# Patient Record
Sex: Male | Born: 1965 | Race: Black or African American | Hispanic: No | Marital: Married | Smoking: Never smoker
Health system: Southern US, Community
[De-identification: ages and names within clinical notes are randomized; demographics above are authoritative.]

## PROBLEM LIST (undated history)

## (undated) DIAGNOSIS — R42 Dizziness and giddiness: Secondary | ICD-10-CM

## (undated) HISTORY — PX: SURGERY SCROTAL / TESTICULAR: SUR1316

## (undated) HISTORY — PX: GANGLION CYST EXCISION: SHX1691

---

## 2011-06-11 ENCOUNTER — Encounter: Payer: Self-pay | Admitting: *Deleted

## 2011-06-11 ENCOUNTER — Emergency Department (HOSPITAL_BASED_OUTPATIENT_CLINIC_OR_DEPARTMENT_OTHER)
Admission: EM | Admit: 2011-06-11 | Discharge: 2011-06-11 | Discharge status: Against Medical Advice | Payer: BC Managed Care – PPO | Attending: Emergency Medicine | Admitting: Emergency Medicine

## 2011-06-11 DIAGNOSIS — R110 Nausea: Secondary | ICD-10-CM | POA: Insufficient documentation

## 2011-06-11 DIAGNOSIS — R197 Diarrhea, unspecified: Secondary | ICD-10-CM | POA: Insufficient documentation

## 2011-06-11 NOTE — ED Notes (Signed)
Pt c/o looses stools x 3 days and nausea x 1 day

## 2012-01-26 ENCOUNTER — Encounter (HOSPITAL_BASED_OUTPATIENT_CLINIC_OR_DEPARTMENT_OTHER): Payer: Self-pay | Admitting: *Deleted

## 2012-01-26 ENCOUNTER — Emergency Department (INDEPENDENT_AMBULATORY_CARE_PROVIDER_SITE_OTHER): Payer: BC Managed Care – PPO

## 2012-01-26 ENCOUNTER — Emergency Department (HOSPITAL_BASED_OUTPATIENT_CLINIC_OR_DEPARTMENT_OTHER)
Admission: EM | Admit: 2012-01-26 | Discharge: 2012-01-26 | Disposition: A | Discharge status: Home / Self Care | Payer: BC Managed Care – PPO | Attending: Emergency Medicine | Admitting: Emergency Medicine

## 2012-01-26 DIAGNOSIS — R059 Cough, unspecified: Secondary | ICD-10-CM | POA: Insufficient documentation

## 2012-01-26 DIAGNOSIS — R509 Fever, unspecified: Secondary | ICD-10-CM | POA: Insufficient documentation

## 2012-01-26 DIAGNOSIS — R0789 Other chest pain: Secondary | ICD-10-CM | POA: Insufficient documentation

## 2012-01-26 DIAGNOSIS — R0602 Shortness of breath: Secondary | ICD-10-CM | POA: Insufficient documentation

## 2012-01-26 DIAGNOSIS — R61 Generalized hyperhidrosis: Secondary | ICD-10-CM | POA: Insufficient documentation

## 2012-01-26 DIAGNOSIS — IMO0001 Reserved for inherently not codable concepts without codable children: Secondary | ICD-10-CM | POA: Insufficient documentation

## 2012-01-26 DIAGNOSIS — R141 Gas pain: Secondary | ICD-10-CM | POA: Insufficient documentation

## 2012-01-26 DIAGNOSIS — R197 Diarrhea, unspecified: Secondary | ICD-10-CM | POA: Insufficient documentation

## 2012-01-26 DIAGNOSIS — R05 Cough: Secondary | ICD-10-CM

## 2012-01-26 DIAGNOSIS — R142 Eructation: Secondary | ICD-10-CM | POA: Insufficient documentation

## 2012-01-26 DIAGNOSIS — R112 Nausea with vomiting, unspecified: Secondary | ICD-10-CM | POA: Insufficient documentation

## 2012-01-26 DIAGNOSIS — J111 Influenza due to unidentified influenza virus with other respiratory manifestations: Secondary | ICD-10-CM

## 2012-01-26 DIAGNOSIS — J3489 Other specified disorders of nose and nasal sinuses: Secondary | ICD-10-CM | POA: Insufficient documentation

## 2012-01-26 MED ORDER — ALBUTEROL SULFATE HFA 108 (90 BASE) MCG/ACT IN AERS
1.0000 | INHALATION_SPRAY | RESPIRATORY_TRACT | Status: DC | PRN
  Administered 2012-01-26: 2 via RESPIRATORY_TRACT
  Filled 2012-01-26: qty 6.7

## 2012-01-26 MED ORDER — HYDROCOD POLST-CHLORPHEN POLST 10-8 MG/5ML PO LQCR: 5.0000 mL | Freq: Two times a day (BID) | ORAL | Status: DC | PRN

## 2012-01-26 NOTE — Discharge Instructions (Signed)

## 2012-01-26 NOTE — ED Notes (Signed)
Pt sts he has had intermittent cough, congestion and N/V/D since Monday.

## 2012-01-27 NOTE — ED Provider Notes (Signed)
History     CSN: [id number]  Arrival date & time 01/26/12  1013   First MD Initiated Contact with Patient 01/26/12 1044      Chief Complaint  Patient presents with  . Emesis    (Consider location/radiation/quality/duration/timing/severity/associated sxs/prior treatment) HPI Comments: Pt reports some mild gneeralzied bloating of abd with several loose BM's starting about 5 days prior, has improved greatly, has had occasional N/V which also seems improved.  He now has been having sinus congestion, having some difficulty breathing through nose, productive cough of yellow phlegm and some chest tightness and occasional sharp pains, does not radiate to arms or jaw or back.  Slight SOB at times, but no exertional fatigue, sweats.  No weight loss, bloody cough, no recent travel, no lower leg swelling or pain at calf.  Pt took theraflue with some improvement yesterday, but this early AM, woke up with sweats, chills, felt chills so decided to be checked out.  He had a family member with similar symptoms who had gotten better, then came down with pneumonia so he was concerned.  Pt does not smoke.    Patient is a 46 y.o. male presenting with vomiting. The history is provided by the patient.  Emesis  Associated symptoms include chills, cough, diarrhea, a fever and myalgias. Pertinent negatives include no headaches.    History reviewed. No pertinent past medical history.  Past Surgical History  Procedure Date  . Ganglion cyst excision     History reviewed. No pertinent family history.  History  Substance Use Topics  . Smoking status: Never Smoker   . Smokeless tobacco: Not on file  . Alcohol Use: No      Review of Systems  Constitutional: Positive for fever, chills and appetite change.  HENT: Positive for sinus pressure. Negative for neck stiffness.   Respiratory: Positive for cough, chest tightness and shortness of breath.   Gastrointestinal: Positive for nausea, vomiting and diarrhea.  Negative for anal bleeding.  Musculoskeletal: Positive for myalgias.  Skin: Negative for rash.  Neurological: Negative for headaches.  All other systems reviewed and are negative.    Allergies  Review of patient's allergies indicates no known allergies.  Home Medications   Current Outpatient Rx  Name Route Sig Dispense Refill  . HYDROCOD POLST-CPM POLST ER 10-8 MG/5ML PO LQCR Oral Take 5 mLs by mouth every 12 (twelve) hours as needed. 100 mL 0  . OLANZAPINE 10 MG PO TABS Oral Take 10 mg by mouth at bedtime.        BP 112/74  Pulse 91  Temp(Src) 98.1 F (36.7 C) (Oral)  Resp 18  Ht 5\' 11"  (1.803 m)  Wt 210 lb (95.255 kg)  BMI 29.29 kg/m2  SpO2 97%  Physical Exam  Nursing note and vitals reviewed. Constitutional: He appears well-developed and well-nourished.  HENT:  Head: Normocephalic and atraumatic.  Nose: No mucosal edema or rhinorrhea. Right sinus exhibits no maxillary sinus tenderness and no frontal sinus tenderness. Left sinus exhibits no maxillary sinus tenderness and no frontal sinus tenderness.  Mouth/Throat: Uvula is midline and oropharynx is clear and moist.  Eyes: Pupils are equal, round, and reactive to light.  Neck: Normal range of motion. Neck supple.  Cardiovascular: Normal rate and regular rhythm.   Pulmonary/Chest: Effort normal. No respiratory distress. He has no wheezes. He has no rales.  Abdominal: Soft. He exhibits no distension. There is no tenderness. There is no guarding.  Neurological: He is alert. No cranial nerve deficit.  Skin:  Skin is warm and dry. No rash noted.    ED Course  Procedures (including critical care time)  Labs Reviewed - No data to display Dg Chest 2 View  01/26/2012  *RADIOLOGY REPORT*  Clinical Data: Productive cough  CHEST - 2 VIEW  Comparison: None.  Findings: Normal mediastinum and cardiac silhouette.  Normal pulmonary  vasculature.  No evidence of effusion, infiltrate, or pneumothorax.  No acute bony abnormality.   IMPRESSION: No acute cardiopulmonary process.  Original Report Authenticated By: Genevive Bi, M.D.     1. Influenza-like illness       MDM  Pt with normal vitals, O2 sats are normal at 97%.  Lungs clear.  CXR which I reviewed, per radiologist shows no acute disease.  Pt is reassured, provided inhaler which pt reports improved his overall symptoms, breathing better, tightness improved.  Also tuissionex prescription.  Abd is soft, he reports no nausea, vomiting and diarrhea has resolved presently.          Gavin Pound. Oletta Lamas, MD 01/27/12 [phone]

## 2012-12-16 ENCOUNTER — Encounter (HOSPITAL_BASED_OUTPATIENT_CLINIC_OR_DEPARTMENT_OTHER): Payer: Self-pay | Admitting: *Deleted

## 2012-12-16 ENCOUNTER — Emergency Department (HOSPITAL_BASED_OUTPATIENT_CLINIC_OR_DEPARTMENT_OTHER)
Admission: EM | Admit: 2012-12-16 | Discharge: 2012-12-16 | Disposition: A | Discharge status: Home / Self Care | Payer: BC Managed Care – PPO | Attending: Emergency Medicine | Admitting: Emergency Medicine

## 2012-12-16 DIAGNOSIS — R42 Dizziness and giddiness: Secondary | ICD-10-CM | POA: Insufficient documentation

## 2012-12-16 DIAGNOSIS — R6883 Chills (without fever): Secondary | ICD-10-CM | POA: Insufficient documentation

## 2012-12-16 DIAGNOSIS — Z79899 Other long term (current) drug therapy: Secondary | ICD-10-CM | POA: Insufficient documentation

## 2012-12-16 DIAGNOSIS — R61 Generalized hyperhidrosis: Secondary | ICD-10-CM | POA: Insufficient documentation

## 2012-12-16 DIAGNOSIS — H538 Other visual disturbances: Secondary | ICD-10-CM | POA: Insufficient documentation

## 2012-12-16 LAB — CBC WITH DIFFERENTIAL/PLATELET
Basophils Absolute: 0 10*3/uL (ref 0.0–0.1)
Eosinophils Relative: 5 % (ref 0–5)
Lymphocytes Relative: 34 % (ref 12–46)
Lymphs Abs: 1.5 10*3/uL (ref 0.7–4.0)
MCV: 90.6 fL (ref 78.0–100.0)
Neutrophils Relative %: 44 % (ref 43–77)
Platelets: 220 10*3/uL (ref 150–400)
RBC: 4.81 MIL/uL (ref 4.22–5.81)
RDW: 13.6 % (ref 11.5–15.5)
WBC: 4.4 10*3/uL (ref 4.0–10.5)

## 2012-12-16 LAB — BASIC METABOLIC PANEL
CO2: 27 mEq/L (ref 19–32)
Calcium: 9.4 mg/dL (ref 8.4–10.5)
GFR calc non Af Amer: 87 mL/min — ABNORMAL LOW (ref >90)
Potassium: 3.6 mEq/L (ref 3.5–5.1)
Sodium: 141 mEq/L (ref 135–145)

## 2012-12-16 LAB — URINALYSIS, ROUTINE W REFLEX MICROSCOPIC
Glucose, UA: NEGATIVE mg/dL
Leukocytes, UA: NEGATIVE
Protein, ur: NEGATIVE mg/dL
Specific Gravity, Urine: 1.028 (ref 1.005–1.030)
Urobilinogen, UA: 1 mg/dL (ref 0.0–1.0)

## 2012-12-16 LAB — GLUCOSE, CAPILLARY: Glucose-Capillary: 83 mg/dL (ref 70–99)

## 2012-12-16 MED ORDER — MECLIZINE HCL 25 MG PO TABS: 25.0000 mg | ORAL_TABLET | Freq: Three times a day (TID) | ORAL | Status: DC | PRN

## 2012-12-16 NOTE — ED Notes (Signed)
Pt. Has no slurred speech and is in no distress.  Pt. Able to walk with c/o feeling dizzy.

## 2012-12-16 NOTE — ED Notes (Signed)
Patient states he was at work this morning and developed dizziness and blurry vision around 0730 am.  States he was seen by the Nurse at work, and told he may have an inner ear infection.  Pt was driven home and told to rest and follow up with the ed later in the day.  Pt states he does not feel any better all day.  Pt states he took a zyrtec last night for his allergies, which he uses as needed.

## 2012-12-16 NOTE — ED Provider Notes (Signed)
Medical screening examination/treatment/procedure(s) were performed by non-physician practitioner and as supervising physician I was immediately available for consultation/collaboration.    Akili Corsetti R Daiwik Buffalo, MD 12/16/12 2344 

## 2012-12-16 NOTE — ED Provider Notes (Signed)
History     CSN: [id number]  Arrival date & time 12/16/12  1620   First MD Initiated Contact with Patient 12/16/12 1717      Chief Complaint  Patient presents with  . Dizziness    (Consider location/radiation/quality/duration/timing/severity/associated sxs/prior treatment) HPI Comments: Pt presents for dizziness x 1 day.  States he went to to work today and began having episodes of blurred vision and feeling off balance.  Went to see the nurse at work and she suggested he go home, rest, and follow up with the ED if no improvement.  States at home he started having sweats with some chills. Symptoms are worse with movement, especially of his head, and relieved by lying still.  Has taken OTC Zyrtec for congestion.  Denies any prior episodes of dizziness.  Denies any chest pain, SOB, abdominal pain, diarrhea or vomiting.  The history is provided by the patient.    History reviewed. No pertinent past medical history.  Past Surgical History  Procedure Date  . Ganglion cyst excision     No family history on file.  History  Substance Use Topics  . Smoking status: Never Smoker   . Smokeless tobacco: Not on file  . Alcohol Use: No      Review of Systems  Neurological: Positive for dizziness and light-headedness.  All other systems reviewed and are negative.    Allergies  Review of patient's allergies indicates no known allergies.  Home Medications   Current Outpatient Rx  Name  Route  Sig  Dispense  Refill  . CETIRIZINE HCL 10 MG PO TABS   Oral   Take 10 mg by mouth daily.         Marland Kitchen HYDROCOD POLST-CPM POLST ER 10-8 MG/5ML PO LQCR   Oral   Take 5 mLs by mouth every 12 (twelve) hours as needed.   100 mL   0   . OLANZAPINE 10 MG PO TABS   Oral   Take 10 mg by mouth at bedtime.             BP 124/85  Pulse 60  Temp 98.5 F (36.9 C) (Oral)  Resp 16  Ht 6' (1.829 m)  Wt 210 lb (95.255 kg)  BMI 28.48 kg/m2  SpO2 100%  Physical Exam  Nursing note and  vitals reviewed. Constitutional: He is oriented to person, place, and time. He appears well-developed and well-nourished.  HENT:  Head: Normocephalic and atraumatic.  Mouth/Throat: Oropharynx is clear and moist.  Eyes: Conjunctivae normal and EOM are normal. Pupils are equal, round, and reactive to light.  Neck: Normal range of motion.  Cardiovascular: Normal rate, regular rhythm and normal heart sounds.   Pulmonary/Chest: Effort normal and breath sounds normal. No respiratory distress.  Abdominal: Soft. Bowel sounds are normal.  Lymphadenopathy:    He has cervical adenopathy (left).  Neurological: He is alert and oriented to person, place, and time. He has normal strength. No cranial nerve deficit or sensory deficit. Coordination and gait normal. GCS eye subscore is 4. GCS verbal subscore is 5. GCS motor subscore is 6.  Skin: Skin is warm and dry.  Psychiatric: He has a normal mood and affect.    ED Course  Procedures (including critical care time)  Labs Reviewed  CBC WITH DIFFERENTIAL - Abnormal; Notable for the following:    Monocytes Relative 16 (*)     All other components within normal limits  BASIC METABOLIC PANEL - Abnormal; Notable for the following:  GFR calc non Af Amer 87 (*)     All other components within normal limits  URINALYSIS, ROUTINE W REFLEX MICROSCOPIC  GLUCOSE, CAPILLARY    Date: 12/16/2012  Rate: 56  Rhythm: sinus bradycardia  QRS Axis: normal  Intervals: normal  ST/T Wave abnormalities: normal  Conduction Disutrbances:none  Narrative Interpretation: sinus bradycardia  Old EKG Reviewed: none available   No results found.   1. Dizziness       MDM  5:45 PM Pt evaluated.  Dizziness persists at this time.  CBC, BMP, CBG, EKG, U/A, and orthostatics pending.  6:52 PM Labs largely unremarkable with the exception of the above.  Pt stable and ok for discharge.  Discussed trial of Meclizine with patient and he agreed.  Encouraged to follow up with  his PCP within the next week to discuss ED visit, medications, and further testing.  Educated patient and wife about stroke sx.   Encouraged to return to the ED for any new or worsening symptoms.      Garlon Hatchet, PA-C 12/16/12 2208

## 2012-12-16 NOTE — ED Notes (Signed)
Family at bedside. 

## 2016-07-21 ENCOUNTER — Encounter (HOSPITAL_BASED_OUTPATIENT_CLINIC_OR_DEPARTMENT_OTHER): Payer: Self-pay | Admitting: *Deleted

## 2016-07-21 ENCOUNTER — Emergency Department (HOSPITAL_BASED_OUTPATIENT_CLINIC_OR_DEPARTMENT_OTHER)
Admission: EM | Admit: 2016-07-21 | Discharge: 2016-07-21 | Disposition: A | Discharge status: Home / Self Care | Payer: BLUE CROSS/BLUE SHIELD | Attending: Emergency Medicine | Admitting: Emergency Medicine

## 2016-07-21 DIAGNOSIS — J069 Acute upper respiratory infection, unspecified: Secondary | ICD-10-CM | POA: Diagnosis present

## 2016-07-21 DIAGNOSIS — J0100 Acute maxillary sinusitis, unspecified: Secondary | ICD-10-CM | POA: Diagnosis not present

## 2016-07-21 HISTORY — DX: Dizziness and giddiness: R42

## 2016-07-21 MED ORDER — AMOXICILLIN 500 MG PO CAPS: 500.0000 mg | ORAL_CAPSULE | Freq: Three times a day (TID) | ORAL | 0 refills | Status: DC

## 2016-07-21 NOTE — ED Triage Notes (Addendum)
Patient states he has a one week history of bilateral eye watering and redness.  States his symptoms have progressed to general fatigue, headache, sinus drainage and nausea.  Denies fever, but has had some chills and sweats.  States several days ago, he felt a pop in his right ear and felt like he had drainage in the ear.

## 2016-07-21 NOTE — ED Provider Notes (Signed)
[facility]   CSN: [id number] Arrival date & time: 07/21/16  1019     History   Chief Complaint Chief Complaint  Patient presents with  . URI    HPI Sean Hunter is a 50 y.o. male.  The history is provided by the patient. No language interpreter was used.  URI   This is a new problem. The problem has been gradually worsening. There has been no fever. Associated symptoms include congestion, headaches, sinus pain and sore throat. He has tried nothing for the symptoms.  Pt has a history of sinus infections.  Pt complains of facial pain, sinus drainage, ear congestion.  Past Medical History:  Diagnosis Date  . Vertigo     There are no active problems to display for this patient.   Past Surgical History:  Procedure Laterality Date  . GANGLION CYST EXCISION    . SURGERY SCROTAL / TESTICULAR         Home Medications    Prior to Admission medications   Medication Sig Start Date End Date Taking? Authorizing Provider  amoxicillin (AMOXIL) 500 MG capsule Take 1 capsule (500 mg total) by mouth 3 (three) times daily. 07/21/16   Elson Areas, PA-C  cetirizine (ZYRTEC) 10 MG tablet Take 10 mg by mouth daily.    Historical Provider, MD  chlorpheniramine-HYDROcodone (TUSSIONEX PENNKINETIC ER) 10-8 MG/5ML LQCR Take 5 mLs by mouth every 12 (twelve) hours as needed. 01/26/12   Quita Skye, MD  meclizine (ANTIVERT) 25 MG tablet Take 1 tablet (25 mg total) by mouth 3 (three) times daily as needed for dizziness. 12/16/12   Garlon Hatchet, PA-C  OLANZapine (ZYPREXA) 10 MG tablet Take 10 mg by mouth at bedtime.      Historical Provider, MD    Family History No family history on file.  Social History Social History  Substance Use Topics  . Smoking status: Never Smoker  . Smokeless tobacco: Never Used  . Alcohol use No     Allergies   Review of patient's allergies indicates no known allergies.   Review of Systems Review of Systems  HENT: Positive  for congestion and sore throat.   Neurological: Positive for headaches.  All other systems reviewed and are negative.    Physical Exam Updated Vital Signs BP 121/84 (BP Location: Right Arm)   Pulse 61   Temp 98.8 F (37.1 C) (Oral)   Resp 18   Ht 6' (1.829 m)   Wt 95.3 kg   SpO2 100%   BMI 28.48 kg/m   Physical Exam  Constitutional: He appears well-developed and well-nourished.  HENT:  Head: Normocephalic and atraumatic.  Eyes: Conjunctivae are normal.  Neck: Neck supple.  Cardiovascular: Normal rate and regular rhythm.   No murmur heard. Pulmonary/Chest: Effort normal and breath sounds normal. No respiratory distress.  Abdominal: Soft. There is no tenderness.  Musculoskeletal: He exhibits no edema.  Neurological: He is alert.  Skin: Skin is warm and dry.  Psychiatric: He has a normal mood and affect.  Nursing note and vitals reviewed.    ED Treatments / Results  Labs (all labs ordered are listed, but only abnormal results are displayed) Labs Reviewed - No data to display  EKG  EKG Interpretation None       Radiology No results found.  Procedures Procedures (including critical care time)  Medications Ordered in ED Medications - No data to display   Initial Impression / Assessment and Plan / ED Course  I  have reviewed the triage vital signs and the nursing notes.  Pertinent labs & imaging results that were available during my care of the patient were reviewed by me and considered in my medical decision making (see chart for details).  Clinical Course    Pt given rx for amoxicillian, I advisedd pt to start back on his allegra D.   Final Clinical Impressions(s) / ED Diagnoses   Final diagnoses:  Subacute maxillary sinusitis    New Prescriptions New Prescriptions   AMOXICILLIN (AMOXIL) 500 MG CAPSULE    Take 1 capsule (500 mg total) by mouth 3 (three) times daily.  An After Visit Summary was printed and given to the patient.   Lonia SkinnerLeslie K  ChillicotheSofia, PA-C 07/21/16 1046    Jacalyn LefevreJulie Haviland, MD 07/21/16 [phone]

## 2017-04-14 ENCOUNTER — Emergency Department (HOSPITAL_BASED_OUTPATIENT_CLINIC_OR_DEPARTMENT_OTHER): Payer: BLUE CROSS/BLUE SHIELD

## 2017-04-14 ENCOUNTER — Encounter (HOSPITAL_BASED_OUTPATIENT_CLINIC_OR_DEPARTMENT_OTHER): Payer: Self-pay | Admitting: *Deleted

## 2017-04-14 ENCOUNTER — Emergency Department (HOSPITAL_BASED_OUTPATIENT_CLINIC_OR_DEPARTMENT_OTHER)
Admission: EM | Admit: 2017-04-14 | Discharge: 2017-04-14 | Disposition: A | Discharge status: Home / Self Care | Payer: BLUE CROSS/BLUE SHIELD | Attending: Emergency Medicine | Admitting: Emergency Medicine

## 2017-04-14 DIAGNOSIS — R1013 Epigastric pain: Secondary | ICD-10-CM

## 2017-04-14 LAB — COMPREHENSIVE METABOLIC PANEL
ALBUMIN: 3.9 g/dL (ref 3.5–5.0)
ALK PHOS: 66 U/L (ref 38–126)
ALT: 23 U/L (ref 17–63)
ANION GAP: 7 (ref 5–15)
AST: 21 U/L (ref 15–41)
BILIRUBIN TOTAL: 0.6 mg/dL (ref 0.3–1.2)
BUN: 12 mg/dL (ref 6–20)
CALCIUM: 9.1 mg/dL (ref 8.9–10.3)
CO2: 27 mmol/L (ref 22–32)
Chloride: 104 mmol/L (ref 101–111)
Creatinine, Ser: 1.08 mg/dL (ref 0.61–1.24)
GFR calc Af Amer: >60 mL/min (ref >60)
GFR calc non Af Amer: >60 mL/min (ref >60)
GLUCOSE: 99 mg/dL (ref 65–99)
Potassium: 4.1 mmol/L (ref 3.5–5.1)
Sodium: 138 mmol/L (ref 135–145)
TOTAL PROTEIN: 7.1 g/dL (ref 6.5–8.1)

## 2017-04-14 LAB — CBC WITH DIFFERENTIAL/PLATELET
BASOS PCT: 0 %
Basophils Absolute: 0 10*3/uL (ref 0.0–0.1)
Eosinophils Absolute: 0.2 10*3/uL (ref 0.0–0.7)
Eosinophils Relative: 5 %
HEMATOCRIT: 41.6 % (ref 39.0–52.0)
HEMOGLOBIN: 13.9 g/dL (ref 13.0–17.0)
LYMPHS ABS: 1.6 10*3/uL (ref 0.7–4.0)
LYMPHS PCT: 32 %
MCH: 30.5 pg (ref 26.0–34.0)
MCHC: 33.4 g/dL (ref 30.0–36.0)
MCV: 91.4 fL (ref 78.0–100.0)
Monocytes Absolute: 0.6 10*3/uL (ref 0.1–1.0)
Monocytes Relative: 13 %
NEUTROS ABS: 2.4 10*3/uL (ref 1.7–7.7)
NEUTROS PCT: 50 %
Platelets: 240 10*3/uL (ref 150–400)
RBC: 4.55 MIL/uL (ref 4.22–5.81)
RDW: 14.1 % (ref 11.5–15.5)
WBC: 4.9 10*3/uL (ref 4.0–10.5)

## 2017-04-14 LAB — LIPASE, BLOOD: Lipase: 19 U/L (ref 11–51)

## 2017-04-14 LAB — URINALYSIS, ROUTINE W REFLEX MICROSCOPIC
Bilirubin Urine: NEGATIVE
Glucose, UA: NEGATIVE mg/dL
Hgb urine dipstick: NEGATIVE
KETONES UR: NEGATIVE mg/dL
LEUKOCYTES UA: NEGATIVE
NITRITE: NEGATIVE
PH: 5.5 (ref 5.0–8.0)
Protein, ur: NEGATIVE mg/dL
SPECIFIC GRAVITY, URINE: 1.025 (ref 1.005–1.030)

## 2017-04-14 LAB — TROPONIN I: Troponin I: <0.03 ng/mL (ref <0.03)

## 2017-04-14 MED ORDER — OMEPRAZOLE 20 MG PO CPDR: 20.0000 mg | DELAYED_RELEASE_CAPSULE | Freq: Every day | ORAL | 0 refills | Status: DC

## 2017-04-14 MED ORDER — RANITIDINE HCL 150 MG PO CAPS: 150.0000 mg | ORAL_CAPSULE | Freq: Every day | ORAL | 0 refills | Status: DC

## 2017-04-14 MED ORDER — GI COCKTAIL ~~LOC~~
30.0000 mL | Freq: Once | ORAL | Status: AC
  Administered 2017-04-14: 30 mL via ORAL
  Filled 2017-04-14: qty 30

## 2017-04-14 NOTE — ED Triage Notes (Signed)
Pt reports intermittent epigastric pain x 4-5 days. Reports taking acid reflux medication without relief.  Denies SOB, N/V.

## 2017-04-14 NOTE — Discharge Instructions (Signed)
Your lab work, chest x-ray, EKG and heart enzymes were normal today.   I suspect your symptoms are from acid reflux, less likely ulcers.   Please take omeprazole and ranitidine as prescribed. Use maalox for additional symptom control. Follow up with a gastroenterology for re-evaluation within 7-10 days and further treatment and diagnostic work up  Return to the emergency department if you develop worsening chest/upper abdominal pain with shortness of breath, nausea, vomiting, sweating, or light-headedness

## 2017-04-14 NOTE — ED Provider Notes (Signed)
[facility] Note   CSN: [id number] Arrival date & time: 04/14/17  1557  By signing my name below, I, Deland PrettySherilynn Knight, attest that this documentation has been prepared under the direction and in the presence of Sharen Hecklaudia Penelope Fittro, PA-C. Electronically Signed: Deland PrettySherilynn Knight, ED Scribe. 04/14/17. 8:09 PM.  History   Chief Complaint Chief Complaint  Patient presents with  . Abdominal Pain   The history is provided by the patient. No language interpreter was used.   HPI Comments: Sean Hunter is a 51 y.o. male, with a PMHx of GERD, who presents to the Emergency Department complaining of 3 episodes of intermittent "sharp" "stinging" moderate epigastric abdominal pain that radiates to chest bone and bilateral chest that began last Wednesday (04/10/2017). Symptoms worsen when he lays on his back, and are slightly alleviated when he lays on his sides. Not worse with exacerbation or food intake. Patient reports mild temporary relief with reflux medication. No PMSx of abdominal surgeries. Denies h/o CAD, MI, DM, ulcers. No heavy use of ETOH or NSAIDs. No associated fever, palpitations, light-headedness, SOB, nausea, vomiting, diarrhea, and melena. Patient currently experiencing symptoms in ED.    Past Medical History:  Diagnosis Date  . Vertigo     There are no active problems to display for this patient.   Past Surgical History:  Procedure Laterality Date  . GANGLION CYST EXCISION    . SURGERY SCROTAL / TESTICULAR         Home Medications    Prior to Admission medications   Medication Sig Start Date End Date Taking? Authorizing Provider  omeprazole (PRILOSEC) 20 MG capsule Take 1 capsule (20 mg total) by mouth daily. 04/14/17 04/29/17  Liberty HandyGibbons, Alani Sabbagh J, PA-C  ranitidine (ZANTAC) 150 MG capsule Take 1 capsule (150 mg total) by mouth daily. 04/14/17   Liberty HandyGibbons, Jerry Haugen J, PA-C    Family History History reviewed. No pertinent family history.  Social  History Social History  Substance Use Topics  . Smoking status: Never Smoker  . Smokeless tobacco: Never Used  . Alcohol use No     Allergies   Patient has no known allergies.   Review of Systems Review of Systems  Constitutional: Negative for fever.  Respiratory: Negative for shortness of breath.   Cardiovascular: Positive for chest pain.  Gastrointestinal: Positive for abdominal pain. Negative for diarrhea, nausea and vomiting.  Genitourinary:       No melena  Musculoskeletal: Negative for back pain and gait problem.     Physical Exam Updated Vital Signs BP (!) 124/91   Pulse (!) 54   Temp 97.8 F (36.6 C) (Oral)   Resp 13   Ht 6' (1.829 m)   Wt 97.1 kg (214 lb)   SpO2 100%   BMI 29.02 kg/m   Physical Exam  Constitutional: He is oriented to person, place, and time. He appears well-developed and well-nourished. No distress.  NAD.  HENT:  Head: Normocephalic and atraumatic.  Right Ear: External ear normal.  Left Ear: External ear normal.  Nose: Nose normal.  Moist mucous membranes. No oropharynx cobblestoning  Eyes: Conjunctivae and EOM are normal. Pupils are equal, round, and reactive to light. No scleral icterus.  Neck: Normal range of motion. Neck supple. No JVD present.  Cardiovascular: Normal rate, regular rhythm, S1 normal, S2 normal, normal heart sounds and intact distal pulses.  Exam reveals no gallop and no friction rub.   No murmur heard. Pulses:      Carotid pulses are 2+ on  the right side, and 2+ on the left side.      Radial pulses are 2+ on the right side, and 2+ on the left side.       Dorsalis pedis pulses are 2+ on the right side, and 2+ on the left side.  SBP and HR wnl. No JVD. Carotid pulses 2+ bilaterally without bruits. Capillary refill brisk in upper extremities.  No varicosities seen. No lower extremity edema.  No orthopnea.  Good lung sounds without crackles.  Pulmonary/Chest: Effort normal and breath sounds normal. He has no  wheezes. He exhibits tenderness.    Abdominal: Soft. He exhibits no distension and no mass. There is tenderness in the right upper quadrant and epigastric area. There is no rebound, no guarding and no CVA tenderness. No hernia.    +Epigastric abdominal tenderness No CVAT bilaterally.  No suprapubic tenderness. Negative Murphy's. Negative McBurney's. Negative Psoas sign.   Musculoskeletal: Normal range of motion. He exhibits no deformity.  Neurological: He is alert and oriented to person, place, and time.  Skin: Skin is warm and dry. Capillary refill takes less than 2 seconds.  Psychiatric: He has a normal mood and affect. His behavior is normal. Judgment and thought content normal.  Nursing note and vitals reviewed.    ED Treatments / Results   DIAGNOSTIC STUDIES: Oxygen Saturation is 100% on RA, normal by my interpretation.   COORDINATION OF CARE: 7:53 PM-Discussed next steps with pt. Pt verbalized understanding and is agreeable with the plan.   Labs (all labs ordered are listed, but only abnormal results are displayed) Labs Reviewed  URINALYSIS, ROUTINE W REFLEX MICROSCOPIC  CBC WITH DIFFERENTIAL/PLATELET  COMPREHENSIVE METABOLIC PANEL  LIPASE, BLOOD  TROPONIN I    EKG  EKG Interpretation  Date/Time:  Sunday April 14 2017 16:09:05 EDT Ventricular Rate:  69 PR Interval:  152 QRS Duration: 84 QT Interval:  368 QTC Calculation: 394 R Axis:   26 Text Interpretation:  Normal sinus rhythm Normal ECG When compared to prior, no significant changes seen.  No STEMI Confirmed by Tegeler, Chris ([id number]) on 04/14/2017 8:04:54 PM       Radiology Dg Chest 2 View  Result Date: 04/14/2017 CLINICAL DATA:  Patient with chest pain and abdominal pain for multiple days. Headache. EXAM: CHEST  2 VIEW COMPARISON:  Chest radiograph 01/26/2012 FINDINGS: Stable cardiac and mediastinal contours. No consolidative pulmonary opacities. No pleural effusion or pneumothorax. Mid thoracic spine  degenerative changes. IMPRESSION: No acute cardiopulmonary process. Electronically Signed   By: Drew  Davis M.D.   On: 04/14/2017 19:40    Procedures Procedures (including critical care time)  Medications Ordered in ED Medications  gi cocktail (Maalox,Lidocaine,Donnatal) (30 mLs Oral Given 04/14/17 2005)     Initial Impression / Assessment and Plan / ED Course  I have reviewed the triage vital signs and the nursing notes.  Pertinent labs & imaging results that were available during my care of the patient were reviewed by me and considered in my medical decision making (see chart for details).     50  year old male presents with epigastric "stinging" discomfort that radiates to sternum and bilateral chest wall 4 days. Symptoms initially intermittent however over the last 24 hours have been constant which is why patient came to ED. Mild, temporary relief with acid reflux medicine. Symptoms worse when laying supine and in the morning. No associated shortness of breath, nausea, vomiting, diaphoresis, palpitations, lightheadedness. No known CAD. Patient is currently having symptoms in ED. Concerned for ACS vs  GERD vs PUD vs PE. History most suggestive of GERD, less suggestive of others.  No h/o HTN, hypercholesterolemia, DM, smoking, positive family hx.  BMI elevated.  On exam VS are wnl. Cardiovascular and pulmonary exam benign. CXR, EKG, troponin x 1 within normal limits.  U/A, CBC, CMP and lipase unremarkable.  Heart score = 1.  Pain has been constant today, delta troponin thought not to be necessary.  Pt was given GI cocktail in ED with significant improvement in symptoms. Patient has ambulated and tolerated PO in ED. Given symptoms, reassuring ED work up, low risk HEART score patient will be discharged with recommendation to follow up with PCP, GI and cardiologist in regards to today's hospital visit. ED return preacutions given. Pt appears reliable for follow up and is agreeable to discharge. Will  d/c with omeprazole, ranitidine and maalox.    Final Clinical Impressions(s) / ED Diagnoses   Final diagnoses:  Epigastric abdominal pain    New Prescriptions Discharge Medication List as of 04/14/2017  9:23 PM    START taking these medications   Details  omeprazole (PRILOSEC) 20 MG capsule Take 1 capsule (20 mg total) by mouth daily., Starting Sun 04/14/2017, Until Mon 04/29/2017, Print    ranitidine (ZANTAC) 150 MG capsule Take 1 capsule (150 mg total) by mouth daily., Starting Sun 04/14/2017, Print       I personally performed the services described in this documentation, which was scribed in my presence. The recorded information has been reviewed and is accurate.      Liberty Handy, PA-C 04/15/17 1610    Tegeler, Canary Brim, MD 04/15/17 [phone]

## 2017-04-15 MED FILL — OMEPRAZOLE DR 20 MG CAPSULE: 20 | 15 days supply | Qty: 15 | Fill #0

## 2017-04-15 MED FILL — raNITIdine HCL 150 MG TABS: 150 | 30 days supply | Qty: 30 | Fill #0

## 2017-04-19 ENCOUNTER — Ambulatory Visit: Payer: BLUE CROSS/BLUE SHIELD | Attending: Internal Medicine | Admitting: Physician Assistant

## 2017-04-19 DIAGNOSIS — R42 Dizziness and giddiness: Secondary | ICD-10-CM | POA: Insufficient documentation

## 2017-04-19 DIAGNOSIS — R1013 Epigastric pain: Secondary | ICD-10-CM | POA: Diagnosis present

## 2017-04-19 NOTE — Progress Notes (Signed)
Patient ID: Sean Hunter, male   DOB: 08/17/1966, 51 y.o.   MRN: [id number]   Sean Hunter, is a 51 y.o. male  [id number]  [id number]  DOB - 12/05/1965  Subjective:  Chief Complaint and HPI: Sean Hunter is a 51 y.o. male here today to establish care and for a follow up visit After being seen in the ED 04/14/2017 for sharp moderate epigastric pain.  The pain has improved on prilosec and zantac. The pain was usu on an empty stomach.  No SOB/palpitations.  No FH early cardiac events.  He is not a smoker.  No h/o htn or DM.  He denies change in appetite.  No constipation/diarrhea/hematochezia.    EKG: Normal sinus rhythm Normal ECG When compared to prior, no significant changes seen.  No STEMI  From ED note: Symptoms worse when laying supine and in the morning. No associated shortness of breath, nausea, vomiting, diaphoresis, palpitations, lightheadedness. No known CAD. Patient is currently having symptoms in ED. Concerned for ACS vs GERD vs PUD vs PE. History most suggestive of GERD, less suggestive of others.  No h/o HTN, hypercholesterolemia, DM, smoking, positive family hx.  BMI elevated.  On exam VS are wnl. Cardiovascular and pulmonary exam benign.CXR, EKG, troponin x 1 within normal limits. U/A, CBC, CMP and lipase unremarkable. Heart score = 1.  Pain has been constant today, delta troponin thought not to be necessary. Pt was given GI cocktail in ED with significant improvement in symptoms. Patient has ambulated and tolerated PO in ED. Given symptoms, reassuring ED work up, low risk HEART score patient will be discharged with recommendation to follow up with PCP, GI and cardiologist in regards to today's hospital visit. ED return preacutions given. Pt appears reliable for follow up and is agreeable to discharge. Will d/c with omeprazole, ranitidine and maalox.  Enzymes negative.  CMP, UA, lipase, CBC all WNL.   Marland Kitchen.   ED/Hospital notes reviewed.   Social History:  married  ROS:    Constitutional:  No f/c, No night sweats, No unexplained weight loss. EENT:  No vision changes, No blurry vision, No hearing changes. No mouth, throat, or ear problems.  Respiratory: No cough, No SOB Cardiac: No CP, no palpitations GI:  +improving abd pain, No N/V/D. GU: No Urinary s/sx Musculoskeletal: No joint pain Neuro: No headache, no dizziness, no motor weakness.  Skin: No rash Endocrine:  No polydipsia. No polyuria.  Psych: Denies SI/HI  No problems updated.  ALLERGIES: No Known Allergies  PAST MEDICAL HISTORY: Past Medical History:  Diagnosis Date  . Vertigo     MEDICATIONS AT HOME: Prior to Admission medications   Medication Sig Start Date End Date Taking? Authorizing Provider  omeprazole (PRILOSEC) 20 MG capsule Take 1 capsule (20 mg total) by mouth daily. 04/14/17 04/29/17  Liberty HandyGibbons, Claudia J, PA-C  ranitidine (ZANTAC) 150 MG capsule Take 1 capsule (150 mg total) by mouth daily. 04/14/17   Liberty HandyGibbons, Claudia J, PA-C     Objective:  EXAM:   BP:  108/72 P: 84 Pulse ox 97% R 20 T  98.6  General appearance : A&OX3. NAD. Non-toxic-appearing HEENT: Atraumatic and Normocephalic.  PERRLA. EOM intact.   Neck: supple, no JVD. No cervical lymphadenopathy. No thyromegaly Chest/Lungs:  Breathing-non-labored, Good air entry bilaterally, breath sounds normal without rales, rhonchi, or wheezing  CVS: S1 S2 regular, no murmurs, gallops, rubs  Abdomen: Bowel sounds present, +tender in mid epigastric region and not distended with no gaurding, rigidity or rebound. Extremities: Bilateral Lower  Ext shows no edema, both legs are warm to touch with = pulse throughout Neurology:  CN II-XII grossly intact, Non focal.   Psych:  TP linear. J/I WNL. Normal speech. Appropriate eye contact and affect.  Skin:  No Rash  Data Review No results found for: HGBA1C   Assessment & Plan   1. Epigastric pain Continue zantac and prilosec - H. pylori breath test Keep appt with GI.  CP  warnings-call 911.    Patient have been counseled extensively about nutrition and exercise  Return in about 6 weeks (around 05/31/2017) for assign PCP; f/up epigastric pain.  The patient was given clear instructions to go to ER or return to medical center if symptoms don't improve, worsen or new problems develop. The patient verbalized understanding. The patient was told to call to get lab results if they haven't heard anything in the next week.     Georgian Co, PA-C [facility] [place], [place] [phone]   04/19/2017, 4:03 PM

## 2017-04-23 ENCOUNTER — Other Ambulatory Visit: Payer: Self-pay | Admitting: Physician Assistant

## 2017-04-23 DIAGNOSIS — A048 Other specified bacterial intestinal infections: Secondary | ICD-10-CM

## 2017-04-23 LAB — H. PYLORI BREATH TEST: H pylori Breath Test: POSITIVE — AB

## 2017-04-23 MED ORDER — OMEPRAZOLE 20 MG PO CPDR: 20.0000 mg | DELAYED_RELEASE_CAPSULE | Freq: Two times a day (BID) | ORAL | 0 refills | Status: DC

## 2017-04-23 MED ORDER — AMOXICILLIN 500 MG PO CAPS: 1000.0000 mg | ORAL_CAPSULE | Freq: Two times a day (BID) | ORAL | 0 refills | Status: DC

## 2017-04-23 MED ORDER — CLARITHROMYCIN 500 MG PO TABS: 500.0000 mg | ORAL_TABLET | Freq: Two times a day (BID) | ORAL | 0 refills | Status: DC

## 2017-04-25 ENCOUNTER — Telehealth: Payer: Self-pay | Admitting: *Deleted

## 2017-04-25 NOTE — Telephone Encounter (Signed)
Medical Assistant left message on patient's home and cell voicemail. Voicemail states to give a call back to [place] [facility] at [phone]. MA informed patient of testing positive for bacteria which causes stomach ulcers.  Patient is aware of 2 antibiotics and omeprazole being sent to the [facility] in [place]. Patient advised to take antibiotics for 10 days and omeprazole for a month.

## 2017-04-25 NOTE — Telephone Encounter (Signed)
-----   Message from Anders SimmondsAngela M McClung, [place]-C sent at 04/23/2017 11:01 AM EDT ----- Please call patient.  He tested positive for the bacteria that causes stomach ulcers.  This is most likely what has been causing his abdominal problems.  I sent him 3 prescriptions to the pharmacy-2 antibiotics and 1 for omeprazole at a higher dose than he has been taking.  The antibiotics he will take for 10 days. He should take the omeprazole for a month.  Thanks, Georgian CoAngela McClung, PA-C

## 2017-05-31 ENCOUNTER — Ambulatory Visit: Payer: BLUE CROSS/BLUE SHIELD | Admitting: Internal Medicine

## 2017-09-26 ENCOUNTER — Encounter (HOSPITAL_BASED_OUTPATIENT_CLINIC_OR_DEPARTMENT_OTHER): Payer: Self-pay

## 2017-09-26 ENCOUNTER — Emergency Department (HOSPITAL_BASED_OUTPATIENT_CLINIC_OR_DEPARTMENT_OTHER)
Admission: EM | Admit: 2017-09-26 | Discharge: 2017-09-26 | Disposition: A | Discharge status: Home / Self Care | Payer: BLUE CROSS/BLUE SHIELD | Attending: Emergency Medicine | Admitting: Emergency Medicine

## 2017-09-26 ENCOUNTER — Other Ambulatory Visit: Payer: Self-pay

## 2017-09-26 DIAGNOSIS — J329 Chronic sinusitis, unspecified: Secondary | ICD-10-CM | POA: Insufficient documentation

## 2017-09-26 DIAGNOSIS — H9202 Otalgia, left ear: Secondary | ICD-10-CM | POA: Insufficient documentation

## 2017-09-26 DIAGNOSIS — B9789 Other viral agents as the cause of diseases classified elsewhere: Secondary | ICD-10-CM | POA: Diagnosis not present

## 2017-09-26 DIAGNOSIS — R0981 Nasal congestion: Secondary | ICD-10-CM | POA: Insufficient documentation

## 2017-09-26 DIAGNOSIS — R51 Headache: Secondary | ICD-10-CM | POA: Insufficient documentation

## 2017-09-26 DIAGNOSIS — R070 Pain in throat: Secondary | ICD-10-CM | POA: Diagnosis present

## 2017-09-26 LAB — RAPID STREP SCREEN (MED CTR MEBANE ONLY): STREPTOCOCCUS, GROUP A SCREEN (DIRECT): NEGATIVE

## 2017-09-26 MED ORDER — MECLIZINE HCL 25 MG PO TABS: 25.0000 mg | ORAL_TABLET | Freq: Three times a day (TID) | ORAL | 0 refills | Status: AC | PRN

## 2017-09-26 MED ORDER — DM-GUAIFENESIN ER 30-600 MG PO TB12: 1.0000 | ORAL_TABLET | Freq: Two times a day (BID) | ORAL | 0 refills | Status: AC

## 2017-09-26 MED FILL — MUCINEX DM ER 600-30 MG TAB: 30-600 | 10 days supply | Qty: 20 | Fill #0

## 2017-09-26 MED FILL — MECLIZINE 25 MG TABLET: 25 | 10 days supply | Qty: 30 | Fill #0

## 2017-09-26 NOTE — ED Provider Notes (Signed)
[facility] EMERGENCY DEPARTMENT Provider Note   CSN: [id number] Arrival date & time: 09/26/17  [id number]     History   Chief Complaint Chief Complaint  Patient presents with  . Sore Throat  . Otalgia    HPI Sean Hunter is a 51 y.o. male.  HPI   Patient is a 51 year old male with a history of allergic rhinitis and vertigo presenting for 5 days of congestion, cough, and frontal headache.  Patient reports he has been coughing up yellow to white phlegm without hematemesis.  Patient reports that his frontal headache is accompanied by some left otalgia approximately 9 out of 10 in severity.  Patient reports that he also began having worsening sore throat yesterday.  Patient denies any recorded fevers at home, although he felt warm yesterday.  Patient denies any nausea, vomiting, abdominal pain.  NyQuil and Allegra-D attempted for symptoms.  Patient has not had any vertiginous symptoms in the course of this illness.  No history of immunosuppressed status.  No history of chronic lung diseases.  Past Medical History:  Diagnosis Date  . Vertigo   . Vertigo     There are no active problems to display for this patient.   Past Surgical History:  Procedure Laterality Date  . GANGLION CYST EXCISION    . SURGERY SCROTAL / TESTICULAR         Home Medications    Prior to Admission medications   Medication Sig Start Date End Date Taking? Authorizing Provider  dextromethorphan-guaiFENesin [facility](MUCINEX DM) 30-600 MG 12hr tablet Take 1 tablet 2 (two) times daily by mouth. 09/26/17   Aviva KluverMurray, Alyssa B, PA-C  meclizine (ANTIVERT) 25 MG tablet Take 1 tablet (25 mg total) 3 (three) times daily as needed by mouth for dizziness. 09/26/17   Elisha PonderMurray, Alyssa B, PA-C    Family History No family history on file.  Social History Social History   Tobacco Use  . Smoking status: Never Smoker  . Smokeless tobacco: Never Used  Substance Use Topics  . Alcohol use: No  . Drug use: No      Allergies   Patient has no known allergies.   Review of Systems Review of Systems  Constitutional: Negative for chills and fever.  HENT: Positive for congestion, sinus pressure, sinus pain and sore throat. Negative for ear discharge, trouble swallowing and voice change.   Eyes: Negative for visual disturbance.  Respiratory: Negative for shortness of breath.   Cardiovascular: Negative for chest pain.  Gastrointestinal: Negative for nausea and vomiting.  Neurological: Negative for dizziness and light-headedness.     Physical Exam Updated Vital Signs BP 118/80 (BP Location: Right Arm)   Pulse 64   Temp 98.1 F (36.7 C) (Oral)   Resp 18   Ht 6\' 1"  (1.854 m)   Wt 96.2 kg (212 lb)   SpO2 100%   BMI 27.97 kg/m   Physical Exam  Constitutional: He appears well-developed and well-nourished. No distress.  Sitting comfortably in bed.  HENT:  Head: Normocephalic and atraumatic.  Mouth/Throat: Uvula is midline and oropharynx is clear and moist.  Normal phonation. No muffled voice sounds. Patient swallows secretions without difficulty. Dentition normal. No lesions of tongue or buccal mucosa. Uvula midline. No asymmetric swelling of the posterior pharynx. No erythema of posterior pharynx. No tonsillar exuduate. No lingual swelling. No induration inferior to tongue. No submandibular tenderness, swelling, or induration.  Tissues of the neck supple. No cervical lymphadenopathy. Right TM without erythema or effusion; left TM without erythema  or effusion. Tenderness palpation over bilateral frontal sinuses.  No tenderness to palpation over bilateral maxillary sinuses.  Eyes: Conjunctivae are normal. Right eye exhibits no discharge. Left eye exhibits no discharge.  EOMs normal to gross examination.  Neck: Normal range of motion. Neck supple.  Cardiovascular: Normal rate and regular rhythm.  No murmur heard. Pulmonary/Chest: Effort normal and breath sounds normal. He has no wheezes. He  has no rales.  Normal respiratory effort. Patient converses comfortably. No audible wheeze or stridor.  Abdominal: He exhibits no distension.  Musculoskeletal: Normal range of motion.  Lymphadenopathy:    He has no cervical adenopathy.  Neurological: He is alert.  Cranial nerves intact to gross observation. Patient moves extremities without difficulty.  Skin: Skin is warm and dry. He is not diaphoretic.  Psychiatric: He has a normal mood and affect. His behavior is normal. Judgment and thought content normal.  Nursing note and vitals reviewed.    ED Treatments / Results  Labs (all labs ordered are listed, but only abnormal results are displayed) Labs Reviewed  RAPID STREP SCREEN (NOT [facility])  CULTURE, GROUP A STREP [facility])    EKG  EKG Interpretation None       Radiology No results found.  Procedures Procedures (including critical care time)  Medications Ordered in ED Medications - No data to display   Initial Impression / Assessment and Plan / ED Course  I have reviewed the triage vital signs and the nursing notes.  Pertinent labs & imaging results that were available during my care of the patient were reviewed by me and considered in my medical decision making (see chart for details).      Final Clinical Impressions(s) / ED Diagnoses   Final diagnoses:  Viral sinusitis   Patient is nontoxic-appearing, afebrile, and in no acute distress.  Patient with symptoms consistent with a viral syndrome, likely viral sinusitis. Vitals are stable, no fever. No signs of dehydration. Lung exam normal, no signs of pneumonia. Supportive therapy indicated with return if symptoms worsen. Rapid strep negative. Suspect viral etiology. Presentation not concerning for PTA or infection spread to soft tissue. No trismus or uvula deviation. Patient with normal phonation. Exam demonstrates soft neck tissue, no swelling or induration inferior to the tongue or in the submandibular space  Rx  for Mucinex and Meclizine, as pt feels his vertigo may get worse with the weather change. Specific return precautions discussed for change in voice, inability to tolerate secretions, difficulty breathing or swallowing, or increased nausea or vomiting. Discussed importance of hydration. Recommended PCP follow up, and reevaluation if symptoms are not improved in the next 3-5 days, and I explained that symptoms less than 7 days are most likely to be a viral sinusitis. All questions answered.  ED Discharge Orders        Ordered    dextromethorphan-guaiFENesin [facility](MUCINEX DM) 30-600 MG 12hr tablet  2 times daily     09/26/17 1023    meclizine (ANTIVERT) 25 MG tablet  3 times daily PRN     09/26/17 1023       Elisha PonderMurray, Alyssa B, PA-C 09/26/17 1027    Maia PlanLong, Joshua G, MD 09/26/17 1930

## 2017-09-26 NOTE — ED Triage Notes (Signed)
Pt reports he has had post nasal drainage, cough and congestion since the weekend. Pt reports a sore throat also that hurt the worst last night. Pt's wife is also sick with similar symptoms.

## 2017-09-26 NOTE — Discharge Instructions (Addendum)
Please read and follow all provided instructions.  Your diagnoses today include:  1. Viral sinusitis     You appear to have an upper respiratory infection (URI). An upper respiratory tract infection, or cold, is a viral infection of the air passages leading to the lungs. It should improve gradually after 5-7 days. You may have a lingering cough that lasts for 2- 4 weeks after the infection.  Symptoms of sinusitis less than 7 days are most likely to be viral and will not be assisted by antibiotics.  Tests performed today include: Vital signs. See below for your results today.  Rapid Strep-NEGATIVE  Medications prescribed:   Take any prescribed medications only as directed. Treatment for your infection is aimed at treating the symptoms. There are no medications, such as antibiotics, that will cure your infection.   Mucinex.  This is a medication to help break up the secretions in your lungs and sinuses.  This also has a cough suppressant in it called dextromethorphan.  Meclizine.  This will help with your vertigo symptoms.  Please do not drive, operate heavy machinery, or drink alcohol while taking this medicine.  Home care instructions:  Follow any educational materials contained in this packet.   Your illness is contagious and can be spread to others, especially during the first 3 or 4 days. It cannot be cured by antibiotics or other medicines. Take basic precautions such as washing your hands often, covering your mouth when you cough or sneeze, and avoiding public places where you could spread your illness to others.   Please continue drinking plenty of fluids.  Use over-the-counter medicines as needed as directed on packaging for symptom relief.  You may also use ibuprofen or tylenol as directed on packaging for pain or fever.  Do not take multiple medicines containing Tylenol or acetaminophen to avoid taking too much of this medication.  Follow-up instructions: Please follow-up with  your primary care provider in the next 3 days for further evaluation of your symptoms if you are not feeling better.   Return instructions:  Please return to the Emergency Department if you experience worsening symptoms.  RETURN IMMEDIATELY IF you develop shortness of breath, confusion or altered mental status, a new rash, become dizzy, faint, or poorly responsive, difficulty breathing or swallowing, or are unable to be cared for at home. Please return if you have persistent vomiting and cannot keep down fluids or develop a fever that is not controlled by tylenol or motrin.   Please return if you have any other emergent concerns.  Additional Information:  Your vital signs today were: BP 118/80 (BP Location: Right Arm)    Pulse 64    Temp 98.1 F (36.7 C) (Oral)    Resp 18    Ht 6\' 1"  (1.854 m)    Wt 96.2 kg (212 lb)    SpO2 100%    BMI 27.97 kg/m  If your blood pressure (BP) was elevated above 135/85 this visit, please have this repeated by your doctor within one month. --------------

## 2017-09-28 LAB — CULTURE, GROUP A STREP (THRC)

## 2018-01-14 IMAGING — CR DG CHEST 2V
2 series · 2 of 2 positions shown · non-contrast
Comparison: Chest radiograph 01/26/2012

CLINICAL DATA: Patient with chest pain and abdominal pain for
multiple days. Headache.

EXAM:
CHEST  2 VIEW

[w chest pa]
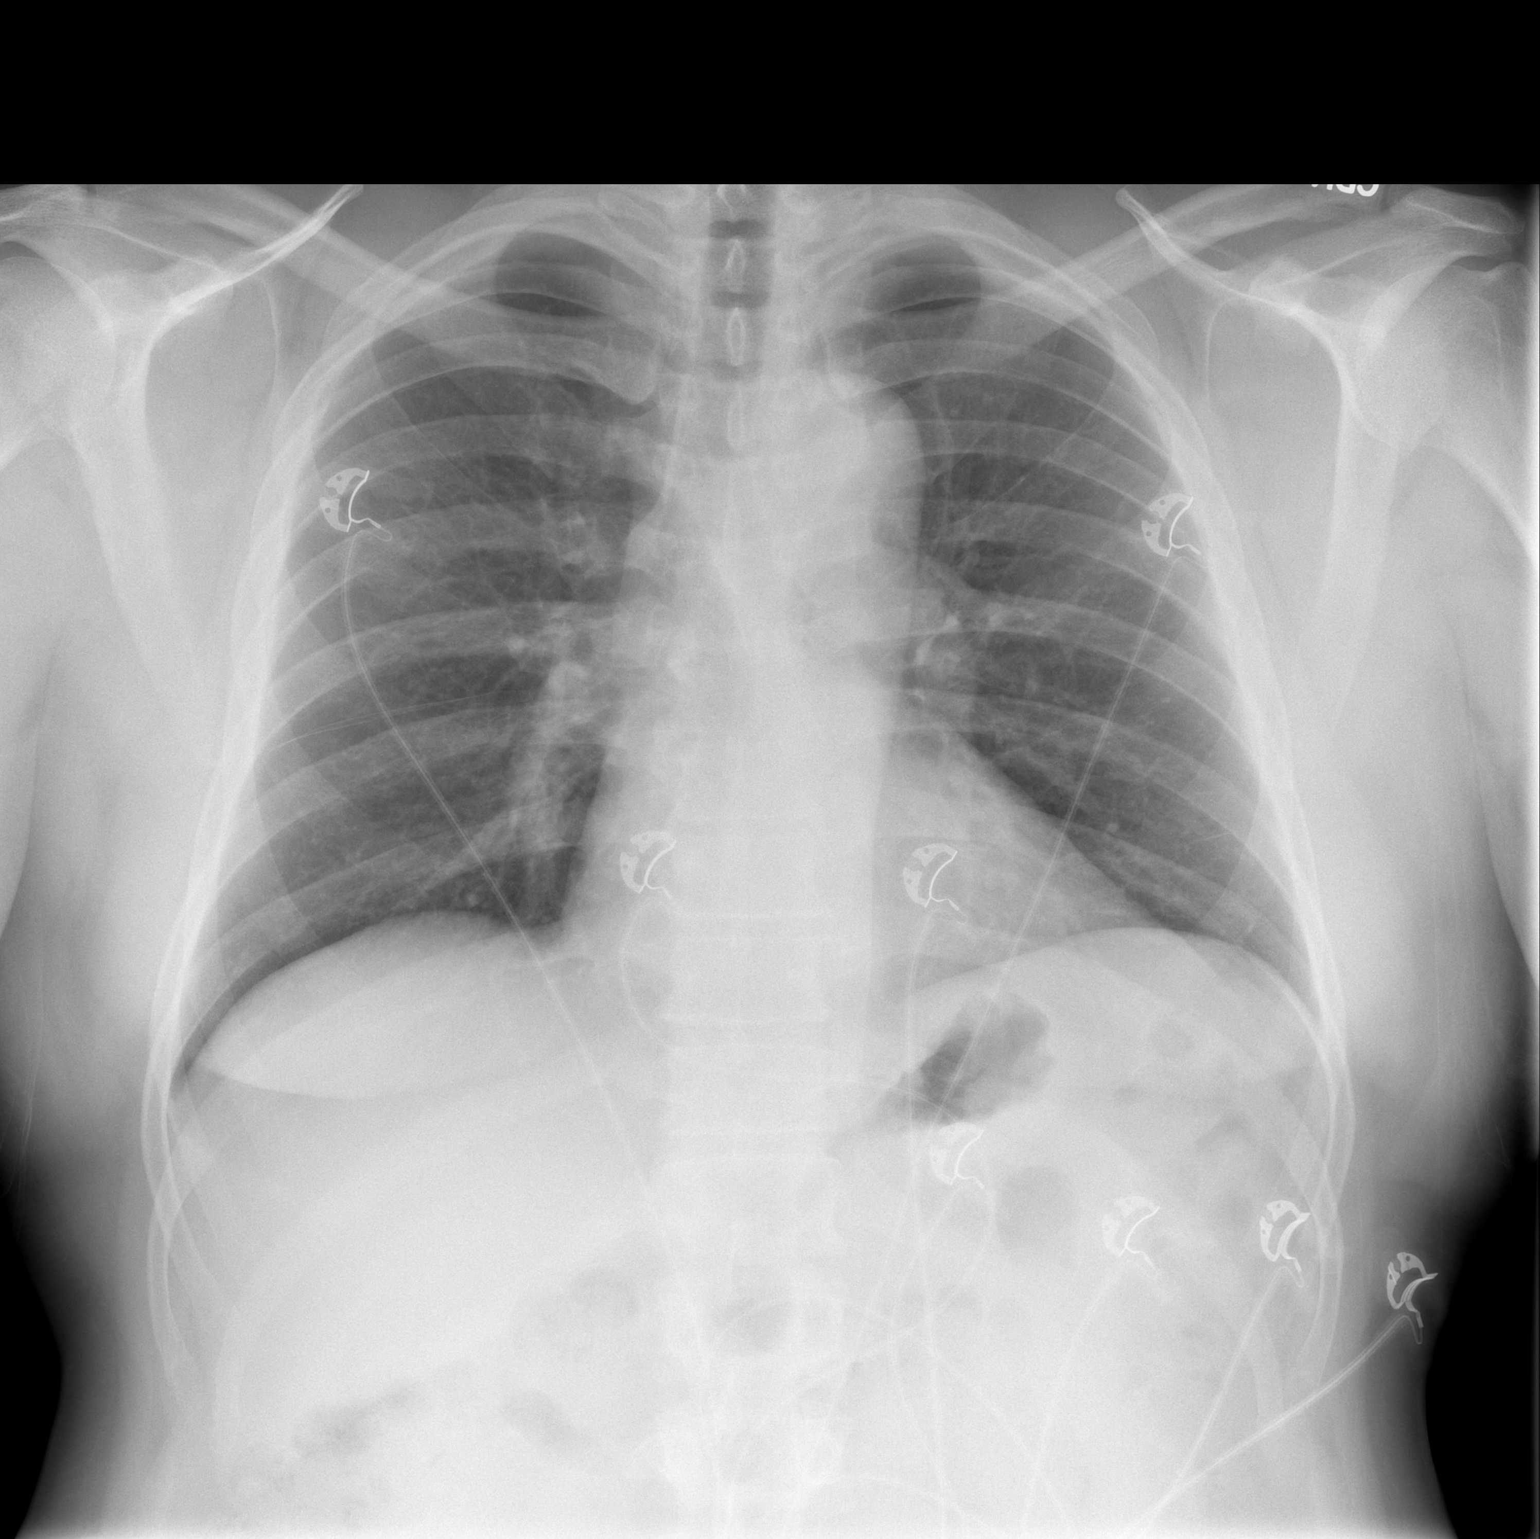

[w chest lat]
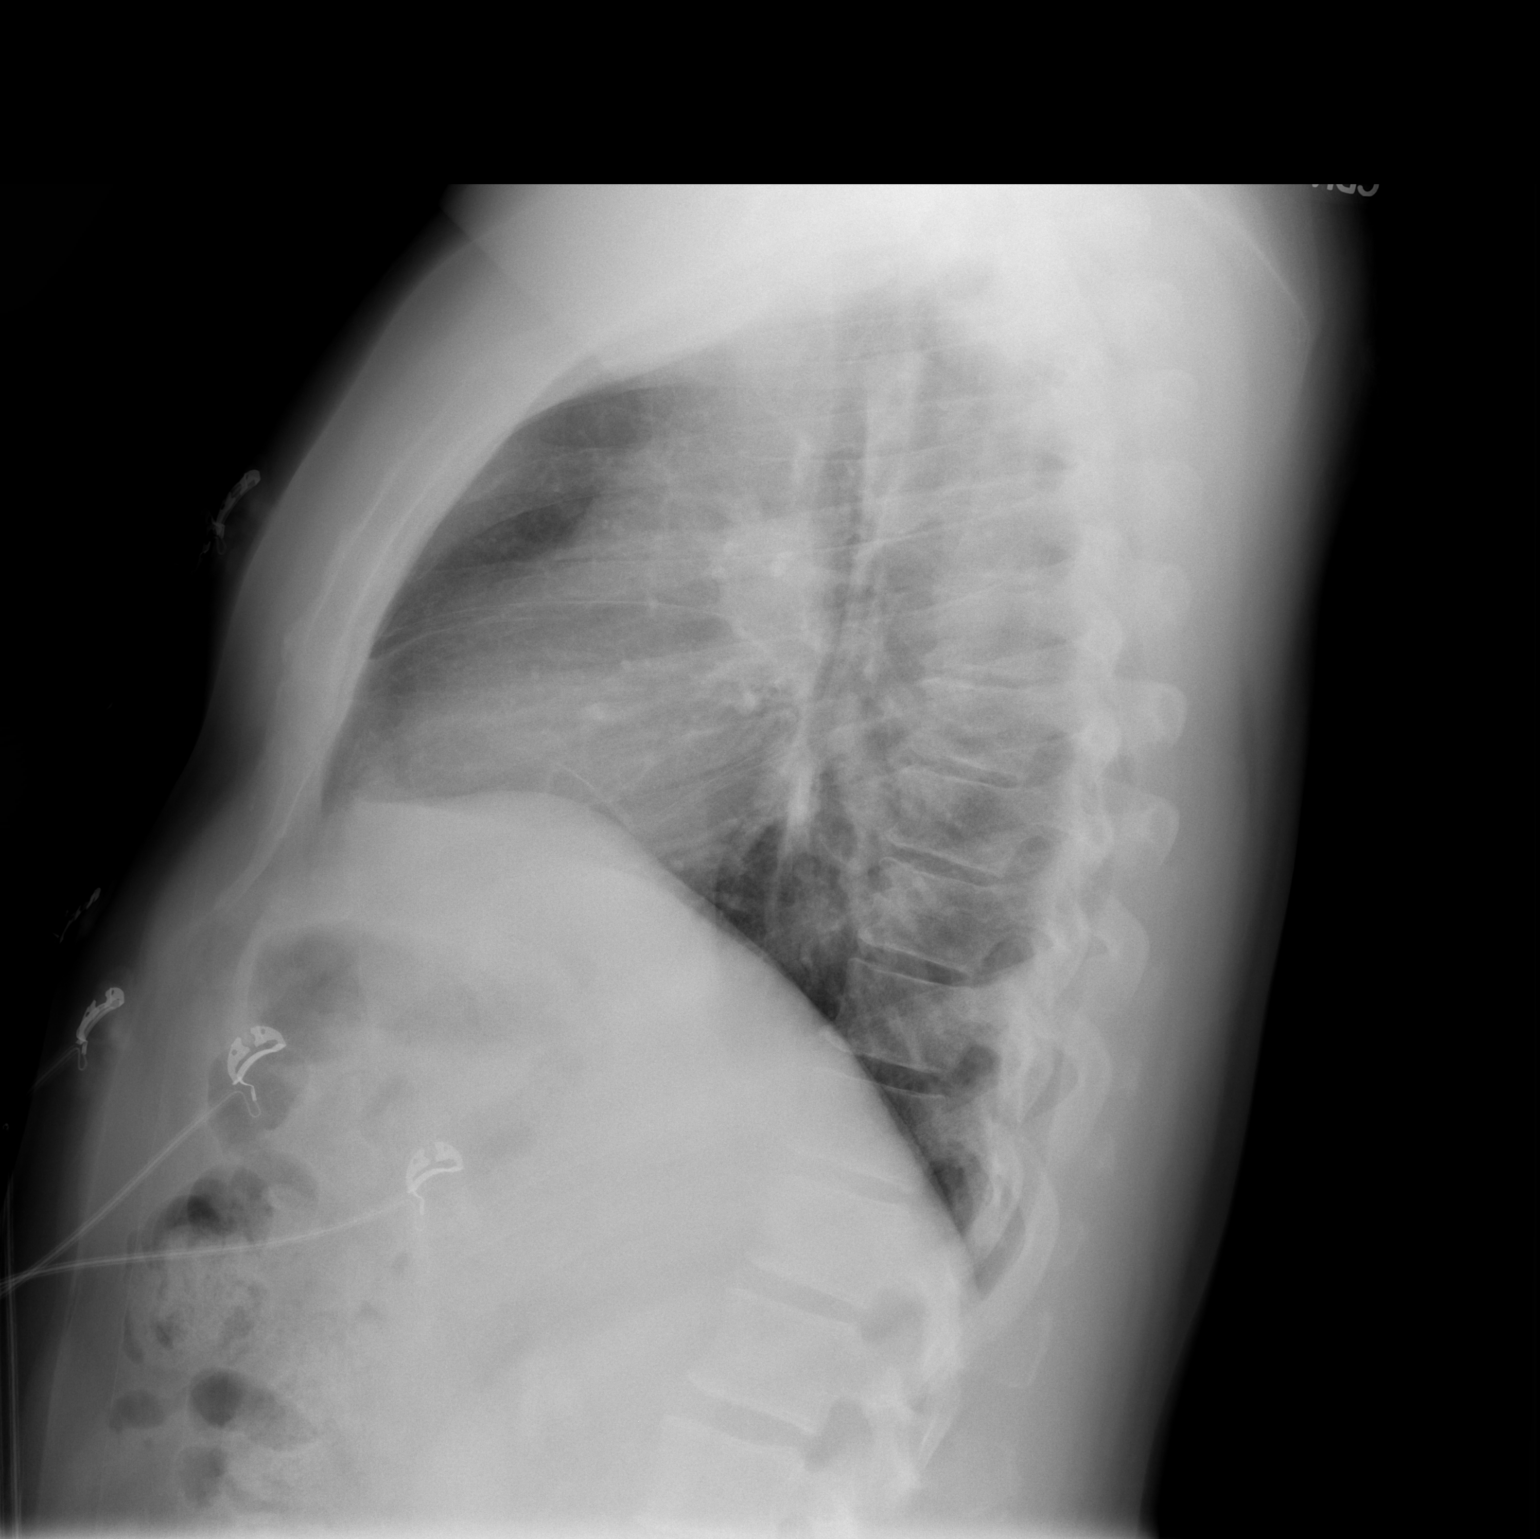

[2 of 2 positions shown; findings below may reference images not displayed]

FINDINGS: Stable cardiac and mediastinal contours. No consolidative pulmonary
opacities. No pleural effusion or pneumothorax. Mid thoracic spine
degenerative changes.
IMPRESSION: No acute cardiopulmonary process.
